# Patient Record
Sex: Male | Born: 2000 | Race: White | Hispanic: No | Marital: Single | Smoking: Never smoker
Health system: Southern US, Community
[De-identification: ages and names within clinical notes are randomized; demographics above are authoritative.]

## PROBLEM LIST (undated history)

## (undated) DIAGNOSIS — F909 Attention-deficit hyperactivity disorder, unspecified type: Secondary | ICD-10-CM

## (undated) DIAGNOSIS — Q539 Undescended testicle, unspecified: Secondary | ICD-10-CM

---
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
## 2018-06-03 DIAGNOSIS — Z713 Dietary counseling and surveillance: Secondary | ICD-10-CM | POA: Diagnosis not present

## 2018-06-03 DIAGNOSIS — Z0001 Encounter for general adult medical examination with abnormal findings: Secondary | ICD-10-CM | POA: Diagnosis not present

## 2018-06-03 DIAGNOSIS — Z09 Encounter for follow-up examination after completed treatment for conditions other than malignant neoplasm: Secondary | ICD-10-CM | POA: Diagnosis not present

## 2018-06-03 DIAGNOSIS — B2790 Infectious mononucleosis, unspecified without complication: Secondary | ICD-10-CM | POA: Diagnosis not present

## 2018-06-03 DIAGNOSIS — Z68.41 Body mass index (BMI) pediatric, 5th percentile to less than 85th percentile for age: Secondary | ICD-10-CM | POA: Diagnosis not present

## 2020-08-09 ENCOUNTER — Telehealth: Payer: Self-pay | Admitting: Pediatrics

## 2020-08-09 NOTE — Telephone Encounter (Signed)
Patient's mom  would like to know if Dr. Beverely Low will take him on as a new patient.  Mom said she is a patient and he is Logan Huang grandson - please advise.

## 2020-08-09 NOTE — Telephone Encounter (Signed)
LM with Amy making her aware that Hurley can call back to schedule an est care appt with Dr. Beverely Low.

## 2020-08-09 NOTE — Telephone Encounter (Signed)
Ok to establish 

## 2020-08-26 ENCOUNTER — Other Ambulatory Visit: Payer: Self-pay

## 2020-08-26 ENCOUNTER — Ambulatory Visit: Payer: Commercial Managed Care - PPO | Admitting: Family Medicine

## 2020-08-26 ENCOUNTER — Encounter: Payer: Self-pay | Admitting: Family Medicine

## 2020-08-26 VITALS — BP 115/80 | HR 105 | Temp 98.1°F | Resp 20 | Ht 72.0 in | Wt 146.2 lb

## 2020-08-26 DIAGNOSIS — Z202 Contact with and (suspected) exposure to infections with a predominantly sexual mode of transmission: Secondary | ICD-10-CM | POA: Diagnosis not present

## 2020-08-26 DIAGNOSIS — N503 Cyst of epididymis: Secondary | ICD-10-CM

## 2020-08-26 DIAGNOSIS — F419 Anxiety disorder, unspecified: Secondary | ICD-10-CM

## 2020-08-26 DIAGNOSIS — Z23 Encounter for immunization: Secondary | ICD-10-CM

## 2020-08-26 NOTE — Progress Notes (Signed)
Subjective:    Patient ID: Logan Huang, male    DOB: [birth year], 20 y.o.   MRN: [id number]  HPI New to establish.  Lives in [place], goes to [employer].  Studying marketing.  Anxiety- pt reports he will have catastrophic thinking.  sxs started 'years' ago.  Pt is very concerned about maintaining his relationship.  Excessive worrying.  Tends to over think any physical sxs  Scrotal lump- [facility].  Was told it was an epididymal cyst.  Did not have an [place].  Not TTP.  No change in size or shape.  Will have some pain in urethra at times.    Review of Systems For ROS see HPI   This visit occurred during the SARS-CoV-2 public health emergency.  Safety protocols were in place, including screening questions prior to the visit, additional usage of staff PPE, and extensive cleaning of exam room while observing appropriate contact time as indicated for disinfecting solutions.       Objective:   Physical Exam Vitals reviewed. Exam conducted with a chaperone present.  Constitutional:      General: He is not in acute distress.    Appearance: Normal appearance. He is not ill-appearing.  HENT:     Head: Normocephalic and atraumatic.  Eyes:     Extraocular Movements: Extraocular movements intact.     Conjunctiva/sclera: Conjunctivae normal.     Pupils: Pupils are equal, round, and reactive to light.  Cardiovascular:     Rate and Rhythm: Normal rate and regular rhythm.     Pulses: Normal pulses.     Heart sounds: Normal heart sounds.  Pulmonary:     Effort: Pulmonary effort is normal. No respiratory distress.     Breath sounds: Normal breath sounds. No wheezing or rales.  Genitourinary:    Pubic Area: No rash.      Penis: Normal and circumcised. No discharge or lesions.      Testes:        Right: Mass or tenderness not present.        Left: Mass or tenderness not present.     Epididymis:     Right: Mass present. No tenderness.     Left: No mass or tenderness.   Musculoskeletal:     Cervical back: Normal range of motion and neck supple.     Right lower leg: No edema.     Left lower leg: No edema.  Skin:    General: Skin is warm and dry.  Neurological:     General: No focal deficit present.     Mental Status: He is alert and oriented to person, place, and time.  Psychiatric:     Comments: Very anxious           Assessment & Plan:  Anxiety- new to provider, ongoing for pt.  Currently anxiety seems to stem from 'not screwing up' his current relationship and physical sxs- real or imagined.  He feels that his anxiety will subside 'once I know everything is ok' (in reference to STDs).  Discussed the possibility of medication but pt is not interested at this time.  Will let me know if that changes.  Possible exposure to STD- new.  Pt seems genuinely confused as to whether he has placed himself at risk.  Feels that 'I would know or my friends would know'.  This is his biggest concern today.  Doesn't feel he needs HIV or syphilis screening, but very interested in GC/CT.  Doesn't trust urine probes and prefers Aptima swab.  Collected w/ chaperone present.  Epididymal cyst- new.  Right sided.  Was told this was epididymal cyst at [facility] but did not have [place] as recommended.  Since pt is very anxious about his GU health, will get [place] to say for certain.  Pt expressed understanding and is in agreement w/ plan.

## 2020-08-26 NOTE — Patient Instructions (Signed)
Follow up in 1 year for your physical- sooner if needed We'll notify you of your lab results and make any changes if needed We'll call you to schedule your ultrasound Try and stop the worry before it starts- acknowledge it, think on it (is that even likely or possible), and then move past it Call with any questions or concerns Welcome!  We're glad to have you!

## 2020-08-27 ENCOUNTER — Encounter: Payer: Self-pay | Admitting: Family Medicine

## 2020-08-30 LAB — GC/CHLAMYDIA PROBE AMP
Chlamydia trachomatis, NAA: NEGATIVE
Neisseria Gonorrhoeae by PCR: NEGATIVE

## 2020-10-06 ENCOUNTER — Encounter: Payer: Self-pay | Admitting: *Deleted

## 2020-10-07 ENCOUNTER — Other Ambulatory Visit: Payer: Self-pay

## 2020-10-07 ENCOUNTER — Encounter: Payer: Self-pay | Admitting: Family Medicine

## 2020-10-07 ENCOUNTER — Ambulatory Visit: Payer: Commercial Managed Care - PPO | Admitting: Family Medicine

## 2020-10-07 VITALS — BP 115/80 | HR 89 | Temp 98.1°F | Resp 18 | Ht 72.0 in | Wt 149.0 lb

## 2020-10-07 DIAGNOSIS — N503 Cyst of epididymis: Secondary | ICD-10-CM | POA: Diagnosis not present

## 2020-10-07 DIAGNOSIS — F419 Anxiety disorder, unspecified: Secondary | ICD-10-CM | POA: Diagnosis not present

## 2020-10-07 DIAGNOSIS — Z202 Contact with and (suspected) exposure to infections with a predominantly sexual mode of transmission: Secondary | ICD-10-CM

## 2020-10-07 DIAGNOSIS — F902 Attention-deficit hyperactivity disorder, combined type: Secondary | ICD-10-CM | POA: Diagnosis not present

## 2020-10-07 MED ORDER — BUPROPION HCL ER (XL) 150 MG PO TB24
150.0000 mg | ORAL_TABLET | Freq: Every day | ORAL | 3 refills | Status: DC
Start: 2020-10-07 — End: 2020-10-29

## 2020-10-07 NOTE — Assessment & Plan Note (Signed)
New.  Pt was anxious at last visit but thought that testing for STDs would improve his sxs.  It did not.  Given his hx of ADHD we will start Wellbutrin and hopefully treat both his anxiety and his ADHD.  Pt is in agreement with this and is excited to try.  Will follow.

## 2020-10-07 NOTE — Patient Instructions (Signed)
Follow up in 6-8 weeks to recheck anxiety We'll notify you of your lab results and make any changes if needed START the Wellbutrin once daily.  Hopefully this improves both ADHD and anxiety We'll call you to schedule your ultrasound Call with any questions or concerns Have a great summer!!

## 2020-10-07 NOTE — Progress Notes (Signed)
   Subjective:    Patient ID: Logan Huang, male    DOB: 2000-11-08, 20 y.o.   MRN: [id number]  HPI Anxiety- pt has 'a fear of STDs'.  He states he doesn't have a particular reason- not sleeping around.  Continues to have intrusive thoughts.  Was previously treated for ADHD w/ Strattera- was in HS.  Sxs have worsened over the past year.  Has taken friend's Adderall w/o improvement in ADHD or anxiety.  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  Tends to perseverate on whether the cyst is cancerous or could indicate some sort of infection.   Review of Systems For ROS see HPI   This visit occurred during the SARS-CoV-2 public health emergency.  Safety protocols were in place, including screening questions prior to the visit, additional usage of staff PPE, and extensive cleaning of exam room while observing appropriate contact time as indicated for disinfecting solutions.      Objective:   Physical Exam Vitals reviewed.  Constitutional:      General: He is not in acute distress.    Appearance: Normal appearance. He is not ill-appearing.  HENT:     Head: Normocephalic and atraumatic.  Skin:    General: Skin is warm and dry.  Neurological:     General: No focal deficit present.     Mental Status: He is alert and oriented to person, place, and time.  Psychiatric:     Comments: Rapid-almost pressured speech. + psychomotor agitation anxious          Assessment & Plan:   Exposure to STD- pt feels that completing the STD panel w/ HIV and RPR will give him peace of mind.  Labs ordered today  Epididymal cyst- pt had scrotal US years ago that showed presence of cyst.  This has continued to worry him as he is worried about cancerous transformation.  Also notes discomfort of groin, particularly on R side (side of cyst).  Will repeat US to assess for change or other issues

## 2020-10-07 NOTE — Assessment & Plan Note (Signed)
Pt reports he was previously on Strattera.  Doesn't feel stimulants are effective for him as he has taken some from his friends.  Will start Wellbutrin and monitor for improvement.

## 2020-10-08 LAB — RPR: RPR Ser Ql: NONREACTIVE

## 2020-10-08 LAB — HIV ANTIBODY (ROUTINE TESTING W REFLEX): HIV 1&2 Ab, 4th Generation: NONREACTIVE

## 2020-10-29 ENCOUNTER — Other Ambulatory Visit: Payer: Self-pay | Admitting: Family Medicine

## 2020-10-29 NOTE — Telephone Encounter (Signed)
Patient is requesting a refill of the following medications: Requested Prescriptions   Pending Prescriptions Disp Refills   buPROPion (WELLBUTRIN XL) 150 MG 24 hr tablet [Pharmacy Med Name: BUPROPION HCL XL 150 MG TABLET] 90 tablet 2    Sig: TAKE 1 TABLET BY MOUTH EVERY DAY    Date of patient request: 10/29/20 Beverely Low pt Last office visit: 10/07/20 Date of last refill: 10/07/20 Last refill amount: 30 R3 Follow up time period per chart: 6-8 weeks

## 2021-10-21 ENCOUNTER — Other Ambulatory Visit: Payer: Self-pay | Admitting: Family Medicine

## 2021-10-21 NOTE — Telephone Encounter (Signed)
Patient is requesting a refill of the following medications: Requested Prescriptions   Pending Prescriptions Disp Refills   buPROPion (WELLBUTRIN XL) 150 MG 24 hr tablet [Pharmacy Med Name: BUPROPION HCL XL 150 MG TABLET] 90 tablet 2    Sig: TAKE 1 TABLET BY MOUTH EVERY DAY    Date of patient request: 10/21/2021 Last office visit: 08/26/2020 Date of last refill: 10/21/2021 Last refill amount: 90 tablets  Follow up time period per chart: n/a

## 2022-03-15 ENCOUNTER — Other Ambulatory Visit (HOSPITAL_COMMUNITY)
Admission: RE | Admit: 2022-03-15 | Discharge: 2022-03-15 | Disposition: A | Discharge status: Home / Self Care | Payer: 59 | Source: Ambulatory Visit | Attending: Family Medicine | Admitting: Family Medicine

## 2022-03-15 ENCOUNTER — Ambulatory Visit (INDEPENDENT_AMBULATORY_CARE_PROVIDER_SITE_OTHER): Payer: 59 | Admitting: Family Medicine

## 2022-03-15 ENCOUNTER — Encounter: Payer: Self-pay | Admitting: Family Medicine

## 2022-03-15 VITALS — BP 120/80 | HR 90 | Temp 98.1°F | Resp 17 | Ht 72.0 in | Wt 160.2 lb

## 2022-03-15 DIAGNOSIS — F419 Anxiety disorder, unspecified: Secondary | ICD-10-CM | POA: Diagnosis not present

## 2022-03-15 DIAGNOSIS — Z202 Contact with and (suspected) exposure to infections with a predominantly sexual mode of transmission: Secondary | ICD-10-CM | POA: Insufficient documentation

## 2022-03-15 MED ORDER — BUPROPION HCL ER (XL) 300 MG PO TB24: 300.0000 mg | ORAL_TABLET | Freq: Every day | ORAL | 3 refills | Status: DC

## 2022-03-15 NOTE — Assessment & Plan Note (Signed)
Ongoing issue for pt.  Interested in increasing dose of Wellbutrin to 300mg  daily as he has a big life change as he graduates from college next week.  New prescription sent.  Will continue to follow.

## 2022-03-15 NOTE — Progress Notes (Signed)
   Subjective:    Patient ID: Logan Huang, male    DOB: 2000/05/25, 21 y.o.   MRN: [id number]  HPI Anxiety- pt is currently on Wellbutrin 150mg  daily.  Pt reports anxiety is high- graduation is next week and he is excited but stressed.  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  Pt is interested in increasing dose.  He continues to worry about STD exposures despite taking precautions.   Review of Systems For ROS see HPI     Objective:   Physical Exam Vitals reviewed.  Constitutional:      General: He is not in acute distress.    Appearance: Normal appearance. He is not ill-appearing.  HENT:     Head: Normocephalic and atraumatic.  Eyes:     Extraocular Movements: Extraocular movements intact.     Conjunctiva/sclera: Conjunctivae normal.     Pupils: Pupils are equal, round, and reactive to light.  Skin:    General: Skin is warm and dry.  Neurological:     General: No focal deficit present.     Mental Status: He is alert and oriented to person, place, and time.  Psychiatric:        Mood and Affect: Mood normal.        Behavior: Behavior normal.        Thought Content: Thought content normal.           Assessment & Plan:   Possible exposure to STD- check labs as this is ongoing source of pt's anxiety.

## 2022-03-15 NOTE — Patient Instructions (Addendum)
Schedule your complete physical in 6 months We'll notify you of your lab results and make any changes if needed INCREASE the Wellbutrin to 300mg  daily- 2 of what you have at home and 1 of the new prescription Call with any questions or concerns Stay Safe!  Stay Healthy! CONGRATS ON GRADUATION!!! Happy Holidays!!!

## 2022-03-16 LAB — RPR: RPR Ser Ql: NONREACTIVE

## 2022-03-16 LAB — HIV ANTIBODY (ROUTINE TESTING W REFLEX): HIV 1&2 Ab, 4th Generation: NONREACTIVE

## 2022-03-16 LAB — URINE CYTOLOGY ANCILLARY ONLY
Chlamydia: NEGATIVE
Comment: NEGATIVE
Comment: NORMAL
Neisseria Gonorrhea: NEGATIVE

## 2022-03-17 ENCOUNTER — Telehealth: Payer: Self-pay

## 2022-03-17 NOTE — Telephone Encounter (Signed)
Pt seen results Via my chart  

## 2022-03-17 NOTE — Telephone Encounter (Signed)
-----   Message from Sheliah Hatch, MD sent at 03/17/2022  7:25 AM EST ----- All STD testing is normal.  Great news!

## 2022-03-27 ENCOUNTER — Telehealth (INDEPENDENT_AMBULATORY_CARE_PROVIDER_SITE_OTHER): Payer: 59 | Admitting: Family Medicine

## 2022-03-27 ENCOUNTER — Encounter: Payer: Self-pay | Admitting: Family Medicine

## 2022-03-27 DIAGNOSIS — J111 Influenza due to unidentified influenza virus with other respiratory manifestations: Secondary | ICD-10-CM | POA: Diagnosis not present

## 2022-03-27 NOTE — Progress Notes (Signed)
   Virtual Visit via Video   I connected with patient on 03/27/22 at 10:00 AM EST by a video enabled telemedicine application and verified that I am speaking with the correct person using two identifiers.  Location patient: Home Location provider: [place] [facility], [place] Persons participating in the virtual visit: Patient, Provider, CMA Sheryle Hail C)  I discussed the limitations of evaluation and management by telemedicine and the availability of in person appointments. The patient expressed understanding and agreed to proceed.  Subjective:   HPI:   Fever- pt developed fever Saturday night.  + congestion, cough, 'weak, very tired'.  + back aches.  Denies skin sensitivity.  + HA- worse w/ coughing.  Denies ear pain.  Mild sore throat.  Some sinus pressure, no tooth pain.  + diarrhea.  No known sick contacts.  COVID test yesterday was negative.    ROS:   See pertinent positives and negatives per HPI.  Patient Active Problem List   Diagnosis Date Noted   Anxiety 10/07/2020   Asthma 02/07/2015   ADHD (attention deficit hyperactivity disorder) 08/10/2011    Social History   Tobacco Use   Smoking status: Never   Smokeless tobacco: Never  Substance Use Topics   Alcohol use: No    Alcohol/week: 0.0 standard drinks of alcohol    Current Outpatient Medications:    buPROPion (WELLBUTRIN XL) 300 MG 24 hr tablet, Take 1 tablet (300 mg total) by mouth daily., Disp: 30 tablet, Rfl: 3  No Known Allergies  Objective:   There were no vitals taken for this visit. AAOx3, NAD NCAT, EOMI No obvious CN deficits Coloring WNL Pt is able to speak clearly, coherently without shortness of breath or increased work of breathing.  Thought process is linear.  Mood is appropriate.   Assessment and Plan:   ILI- new.  Pt's sxs are highly suspicious for and consistent w/ the flu.  Abrupt onset, high fevers, body aches, diarrhea, cough, headache, fatigue.  COVID test was negative.  Discussed  diagnosis and reviewed supportive measures- fluids, rest, alternating tylenol/ibuprofen for pain/fever, cough syrup prn.  Reviewed red flags that should prompt return/attention.  Pt expressed understanding and is in agreement w/ plan.    Neena Rhymes, MD 03/27/2022

## 2022-04-07 ENCOUNTER — Other Ambulatory Visit: Payer: Self-pay | Admitting: Family Medicine

## 2023-04-24 ENCOUNTER — Other Ambulatory Visit: Payer: Self-pay | Admitting: Family Medicine

## 2023-04-25 ENCOUNTER — Other Ambulatory Visit: Payer: Self-pay | Admitting: Family Medicine

## 2023-04-25 ENCOUNTER — Telehealth: Payer: Self-pay | Admitting: Family Medicine

## 2023-04-25 NOTE — Telephone Encounter (Signed)
 Copied from CRM [id number]. Topic: Clinical - Medication Refill >> Apr 25, 2023 12:56 PM Logan Huang wrote: Most Recent Primary Care Visit:  Provider: TABORI, KATHERINE E  Department: [facility]  Visit Type: MYCHART VIDEO VISIT  Date: 03/27/2022  Medication: buPROPion  (WELLBUTRIN  XL) 300 MG 24 hr tablet  Has the patient contacted their pharmacy? Yes (Agent: If no, request that the patient contact the pharmacy for the refill. If patient does not wish to contact the pharmacy document the reason why and proceed with request.) (Agent: If yes, when and what did the pharmacy advise?)  Is this the correct pharmacy for this prescription? Yes If no, delete pharmacy and type the correct one.  This is the patient's preferred pharmacy:   [id number] [place], [place] - [address] AT [facility] [address] [place] [address] Phone: [phone] Fax: [phone]   Has the prescription been filled recently? No  Is the patient out of the medication? Yes  Has the patient been seen for an appointment in the last year OR does the patient have an upcoming appointment? No  Can we respond through MyChart? No  Agent: Please be advised that Rx refills may take up to 3 business days. We ask that you follow-up with your pharmacy.

## 2023-05-02 ENCOUNTER — Telehealth: Payer: 59 | Admitting: Family Medicine

## 2023-05-02 ENCOUNTER — Encounter: Payer: Self-pay | Admitting: Family Medicine

## 2023-05-02 DIAGNOSIS — F419 Anxiety disorder, unspecified: Secondary | ICD-10-CM

## 2023-05-02 MED ORDER — BUPROPION HCL ER (XL) 300 MG PO TB24: 300.0000 mg | ORAL_TABLET | Freq: Every day | ORAL | 2 refills | Status: DC

## 2023-05-02 NOTE — Progress Notes (Signed)
   Virtual Visit via Video   I connected with patient on 05/02/23 at 10:40 AM EST by a video enabled telemedicine application and verified that I am speaking with the correct person using two identifiers.  Location patient: Home Location provider: [facility], Office Persons participating in the virtual visit: Patient, Provider, CMA Archie Patten H)  I discussed the limitations of evaluation and management by telemedicine and the availability of in person appointments. The patient expressed understanding and agreed to proceed.  Subjective:   HPI:   Anxiety- currently on Wellbutrin 300mg  daily.  Pt recently got a promotion for work.  He has been somewhat stressed at work w/ the transition period at work but is looking forward to the challenge.  Pt feels that anxiety is under control for now.  Feels that he is doing better regarding over-thinking.  ROS:   See pertinent positives and negatives per HPI.  Patient Active Problem List   Diagnosis Date Noted   Anxiety 10/07/2020   Asthma 02/07/2015   ADHD (attention deficit hyperactivity disorder) 08/10/2011    Social History   Tobacco Use   Smoking status: Never   Smokeless tobacco: Never  Substance Use Topics   Alcohol use: No    Alcohol/week: 0.0 standard drinks of alcohol    Current Outpatient Medications:    buPROPion (WELLBUTRIN XL) 300 MG 24 hr tablet, Take 1 tablet (300 mg total) by mouth daily., Disp: 90 tablet, Rfl: 2  No Known Allergies  Objective:   There were no vitals taken for this visit. AAOx3, NAD NCAT, EOMI No obvious CN deficits Coloring WNL Pt is able to speak clearly, coherently without shortness of breath or increased work of breathing.  Thought process is linear.  Mood is appropriate.   Assessment and Plan:   Anxiety- pt is doing well.  He is busy at work, just got a promotion, and excited about the opportunity.  He feels he is doing better about not over-thinking things.  Feels medication is  appropriate and not having any side effects.  No changes at this time.  Will provide refills as needed.   Neena Rhymes, MD 05/02/2023

## 2024-02-18 ENCOUNTER — Other Ambulatory Visit: Payer: Self-pay | Admitting: Family Medicine
